# Patient Record
Sex: Male | Born: 1960 | Race: White | Hispanic: No | Marital: Single | Smoking: Former smoker
Health system: Southern US, Community
[De-identification: ages and names within clinical notes are randomized; demographics above are authoritative.]

## PROBLEM LIST (undated history)

## (undated) HISTORY — PX: FRACTURE SURGERY: SHX138

---

## 2008-02-19 ENCOUNTER — Inpatient Hospital Stay (HOSPITAL_COMMUNITY): Admission: EM | Admit: 2008-02-19 | Discharge: 2008-02-20 | Payer: Self-pay | Admitting: Emergency Medicine

## 2009-03-25 IMAGING — RF DG TIBIA/FIBULA 2V*L*
1 series · 7 of 7 positions shown · non-contrast
Comparison: 02/19/2008

CLINICAL DATA: Fracture left ankle.  ORIF.

LEFT TIBIA AND FIBULA - 2 VIEW

[Series 1: run · 7 of 7 slices shown]
[im 1/7]
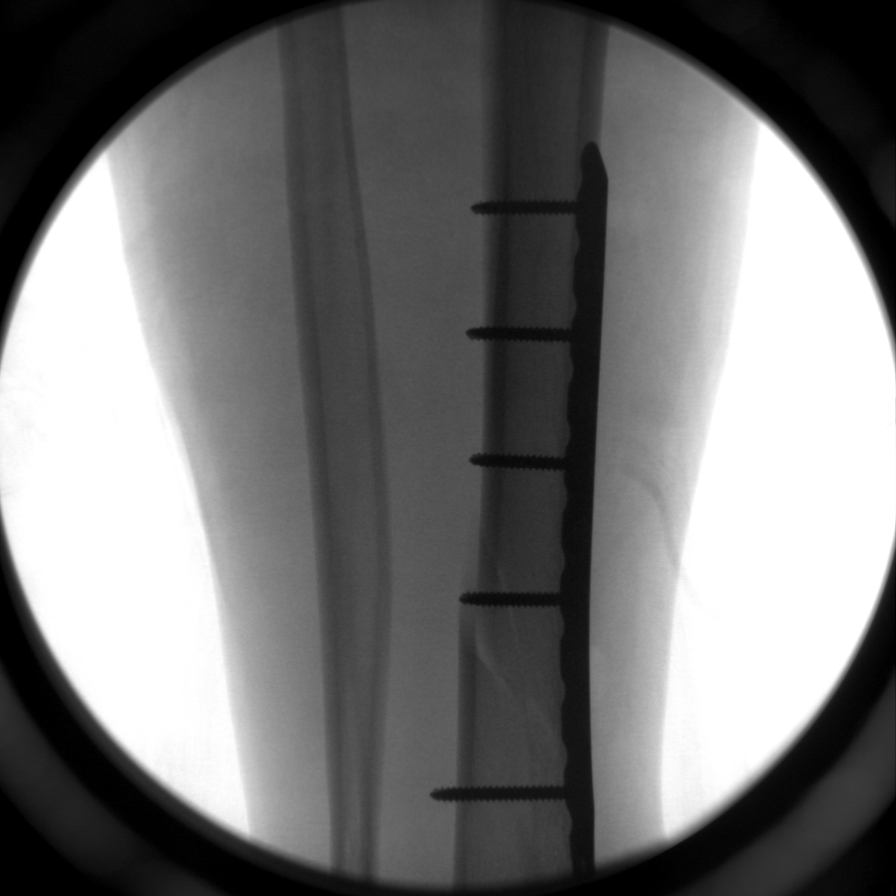
[im 2/7]
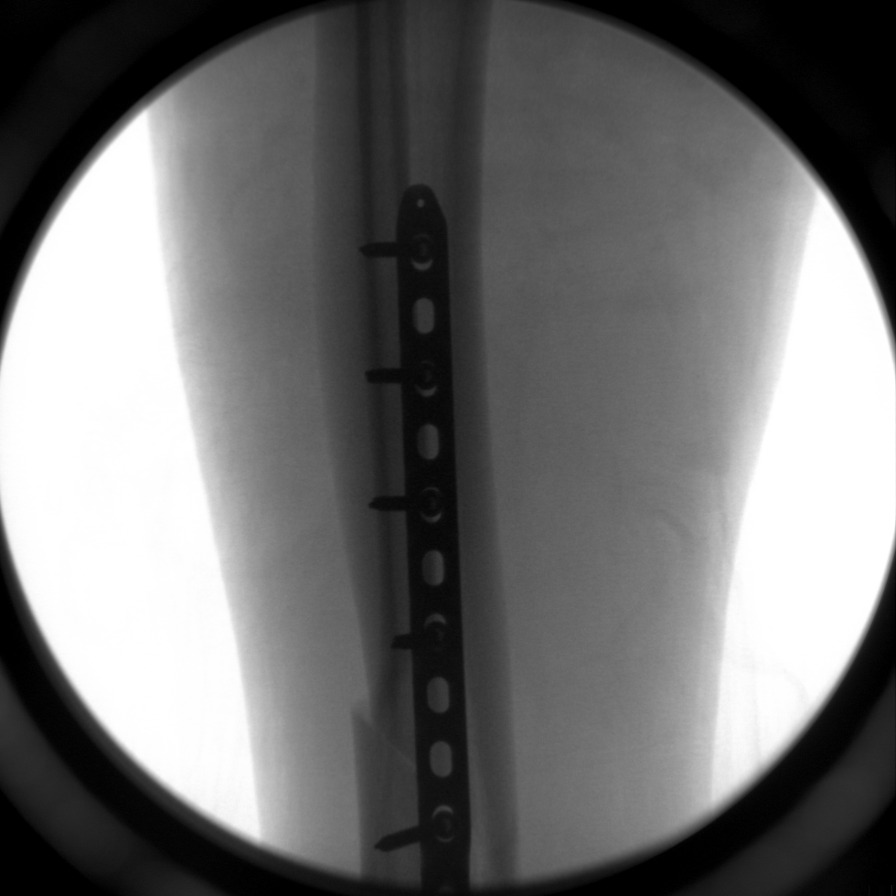
[im 3/7]
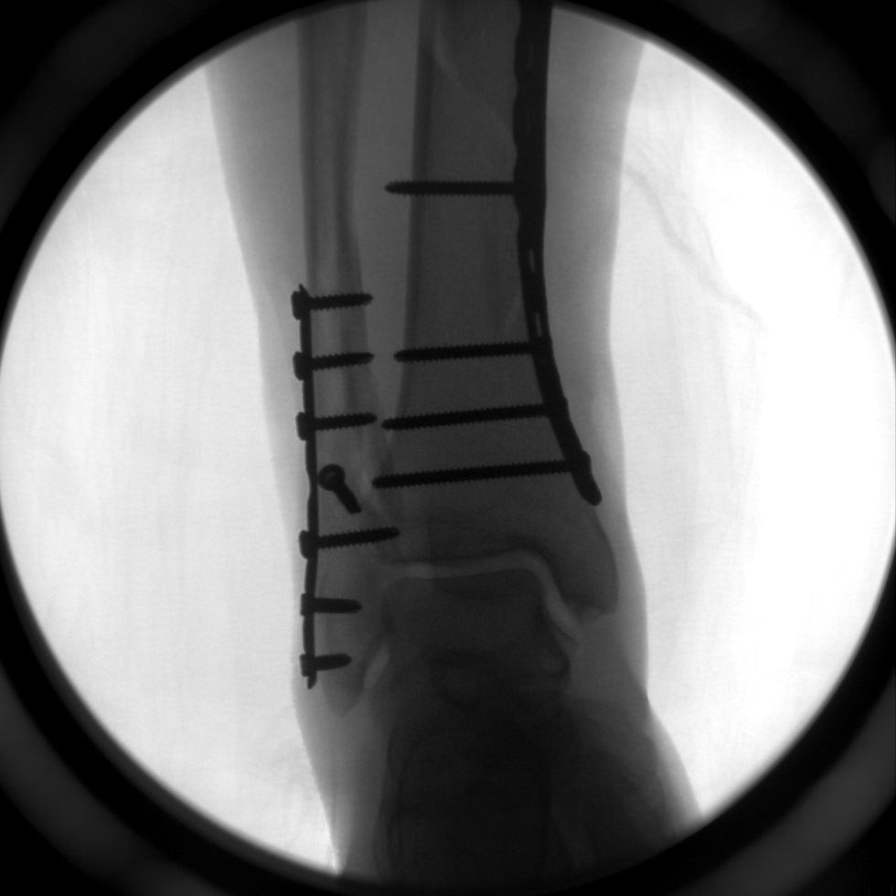
[im 4/7]
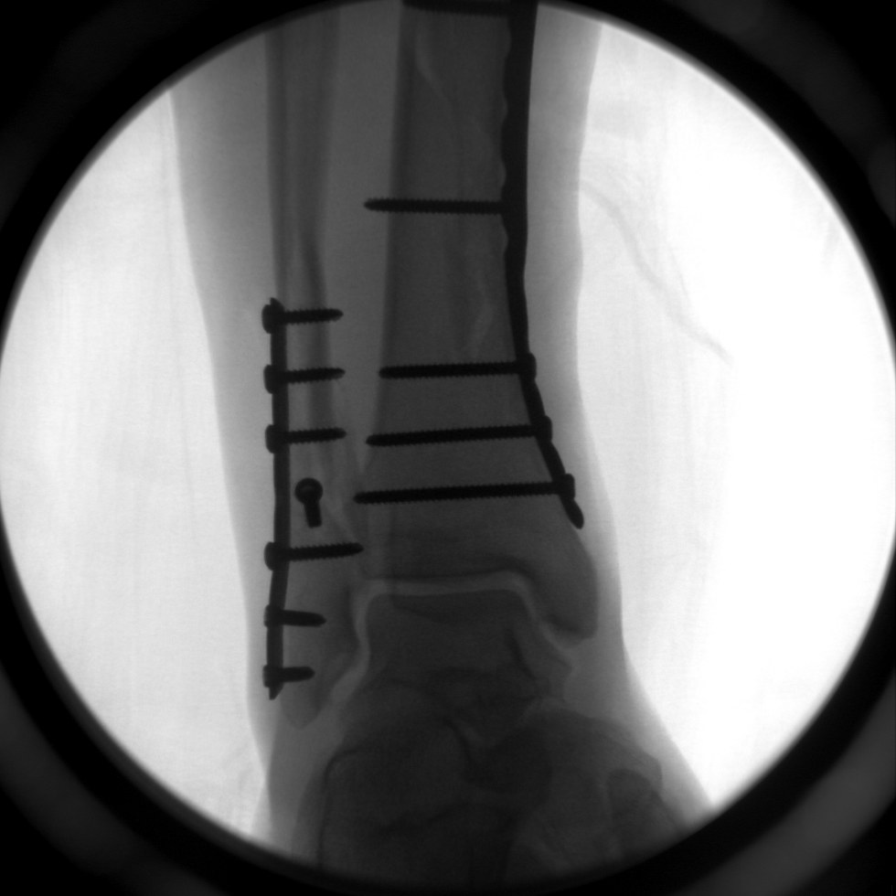
[im 5/7]
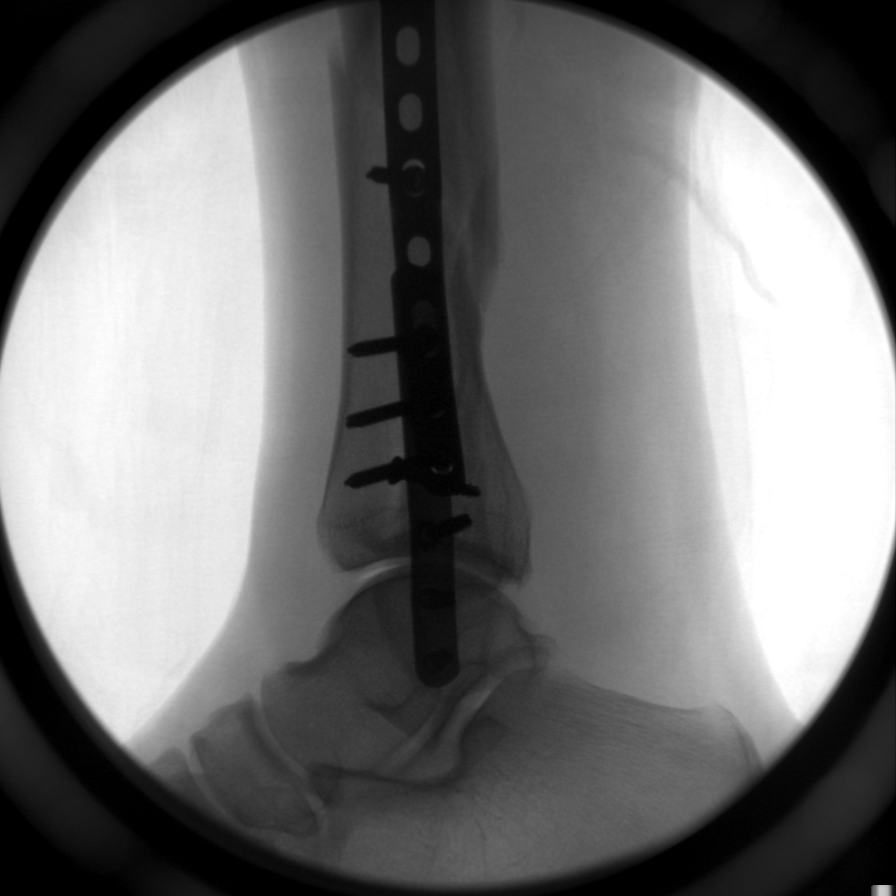
[im 6/7]
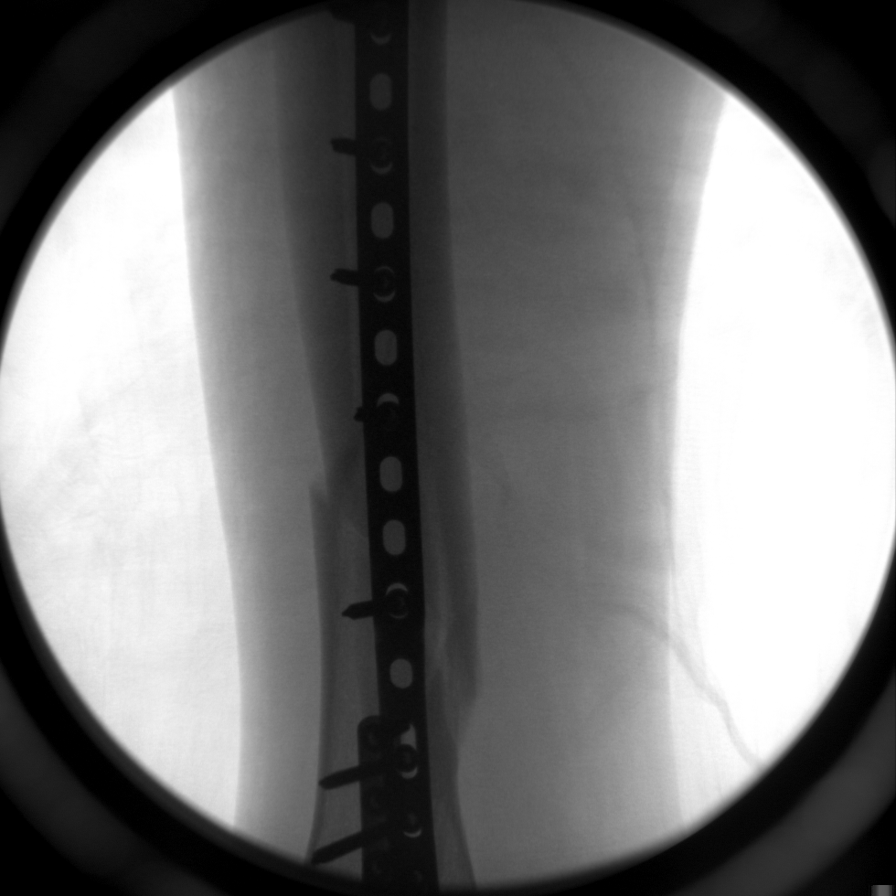
[im 7/7]
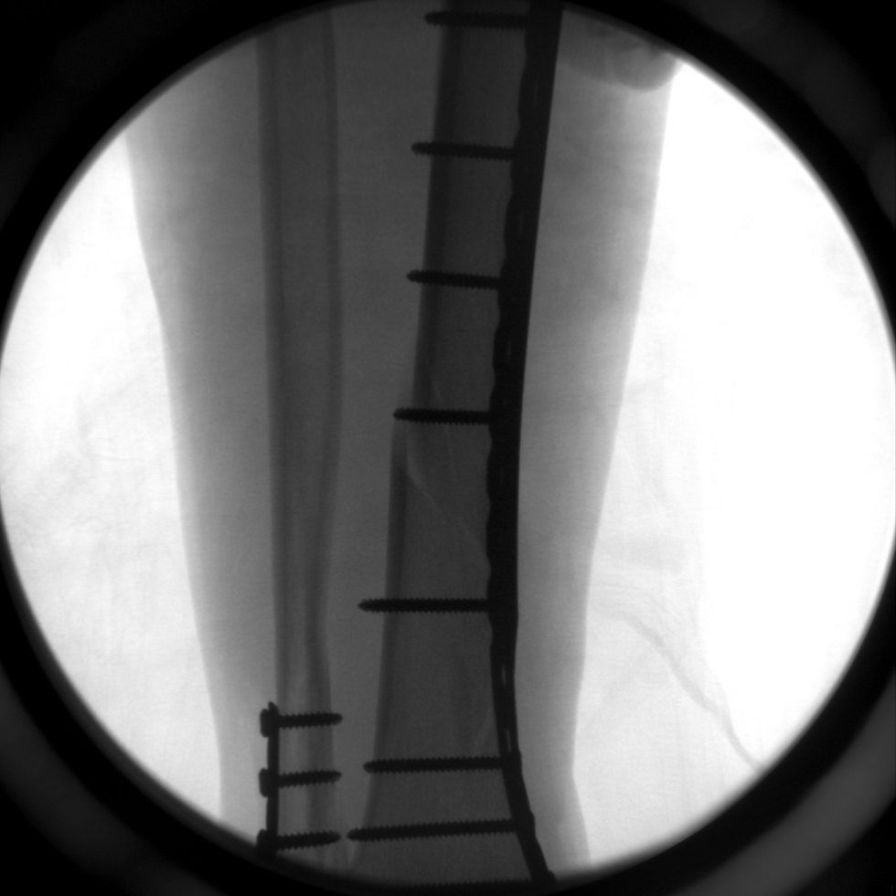

[7 of 7 positions shown; findings below may reference images not displayed]

FINDINGS: Images demonstrate a lateral screw plate fixation of
distal tibial and fibular fractures.  No new fractures are
identified.
IMPRESSION: ORIF tib-fib fracture.

## 2011-02-18 NOTE — Op Note (Signed)
NAME:  Richard Mcclain, Richard Mcclain               ACCOUNT NO.:  [id number]   MEDICAL RECORD NO.:  [id number]          PATIENT TYPE:  INP   LOCATION:  2550                         FACILITY:  [facility]   PHYSICIAN:  Leonides Grills, M.D.     DATE OF BIRTH:  Dec 04, 1960   DATE OF PROCEDURE:  DATE OF DISCHARGE:                               OPERATIVE REPORT   PREOPERATIVE DIAGNOSES:  1. Closed left spiral tibia fracture.  2. Closed left comminuted lateral malleolus fracture.   POSTOPERATIVE DIAGNOSES:  1. Closed left spiral tibia fracture.  2. Closed left comminuted lateral malleolus fracture.   OPERATION:  1. Open reduction and internal fixation of left tibia fracture.  2. Open reduction and internal fixation of left comminuted lateral      malleolus fracture.  3. Stress x-rays of the left leg.   ANESTHESIA:  General.   SURGEON:  Leonides Grills, M.D.   ASSISTANT:  Richardean Canal, P.A.   ESTIMATED BLOOD LOSS:  Minimal.   TOURNIQUET TIME:  Approximately an hour and a half.   COMPLICATIONS:  None.   DISPOSITION:  Stable to PAR.   INDICATION:  This is a 50 year old male who sustained the above injury.  He was consented for the above procedure.  All risks which include  infection or vessel injury, nonunion or malunion, hardware irritation,  hardware failure, wound complications, DVT, PE, possible death,  prolonged recovery, stiffness, or arthritis were all explained.  Questions were encouraged and answered.   OPERATION:  The patient was brought to the operating room and placed in  the supine position after adequate general endotracheal tube anesthesia  was administered as well as Ancef 1 g IV piggyback.  The left lower  extremity was prepped and draped in a sterile manner over a proximally  placed thigh tourniquet.  The limb was gravity exsanguinated.  Tourniquet was elevated to 290 mmHg.  Under x-ray guidance, the fracture  site was then visualized.  Anatomical landmarks were mapped out.  We  then  made a longitudinal incision over the medial aspect of the tibia.  This was approximately 3 cm to 4 cm.  We then tunneled to the fracture  site itself.  We then chose a 14-hole Synthes distal tibial nonlocking  plate.  We then tunneled this to the fracture site.  We then made small  incisions on both medial and lateral aspects of the tibia, and through  this, we were able to see the fracture site and slide the plate onto the  proximal portion of the tibia.  With the plate placed in the proper  position, we then reduced the fracture with a two-point Weber reduction  clamps.  Once this was anatomically placed and reduced and this was  verified in the AP and lateral planes, we then started our fixation.  We  placed a screw just distal to the fracture site through a small incision  over the plate.  Once this was placed, we then were able to place the  remaining screws, 3 most distal and 4 proximal with a hole in between  each screw and the proximal-most  screws for balance fixation.  Once this  was done through the incisions over each hole, we then obtained stress x-  rays in the AP and lateral planes that showed no gross motion, fixation,  proposition, and external alignment as well.  Once the tibia was  completed, we then made a longitudinal incision over the lateral  malleolus.  Dissection was carried down through the skin.  Hemostasis  was obtained.  Fracture site was then encountered.  This was comminuted  and had not only an oblique component but also a sagittal split of the  posterior portion of the proximal fragment.  We then carefully reduced  the fracture with 2 separate reduction clamps.  We then applied a 7-hole  one-third tubular locking plate to the lateral surface of the fibula.  We then sequentially placed a 3.5-mm fully-threaded cortical set screws  using a 2.5-mm hole respectively.  This had excellent purchase and  maintenance of the correction.  We then placed a set screw from  anterior  to posterior across the fracture site as well.  Distally, we placed two  3.5-mm fully-threaded cortical locking screws using the bullet and a 2.8-  mm drill.  Once this was done, we then obtained the final stress x-rays  in the AP and lateral planes, and they showed no gross motion, fixation,  proposition, and external alignment as well over the entire tib-fib  area.  Tourniquet was deflated.  Hemostasis was obtained.  There was no  pulsatile bleeding.  The wounds were copiously irrigated with normal  saline.  Subcutaneous was closed with 3-0 Vicryl.  Skin was closed with  4-0 nylon over all wounds.  Sterile dressing was applied.  Modified  Jones dressing was applied with the ankle in neutral dorsiflexion.  The  patient was stable to the PAR.      Leonides Grills, M.D.  Electronically Signed     PB/MEDQ  D:  02/20/2008  T:  02/20/2008  Job:  [id number]
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
## 2011-07-02 LAB — COMPREHENSIVE METABOLIC PANEL
ALT: 17
AST: 23
Albumin: 4.1
Alkaline Phosphatase: 42
BUN: 10
CO2: 28
Calcium: 9
Chloride: 104
Creatinine, Ser: 0.76
GFR calc Af Amer: >60
GFR calc non Af Amer: >60
Glucose, Bld: 118 — ABNORMAL HIGH
Potassium: 3.6
Sodium: 139
Total Bilirubin: 0.9
Total Protein: 6.4

## 2011-07-02 LAB — CBC
HCT: 42.6
Hemoglobin: 14.4
MCHC: 34
MCV: 92.1
Platelets: 203
RBC: 4.62
RDW: 12.6
WBC: 9.4

## 2011-07-02 LAB — DIFFERENTIAL
Basophils Absolute: 0
Basophils Relative: 0
Eosinophils Absolute: 0.1
Eosinophils Relative: 1
Lymphocytes Relative: 17
Lymphs Abs: 1.6
Monocytes Absolute: 0.5
Monocytes Relative: 6
Neutro Abs: 7.1
Neutrophils Relative %: 76

## 2011-07-02 LAB — URINALYSIS, ROUTINE W REFLEX MICROSCOPIC
Bilirubin Urine: NEGATIVE
Glucose, UA: NEGATIVE
Hgb urine dipstick: NEGATIVE
Ketones, ur: NEGATIVE
Nitrite: NEGATIVE
Protein, ur: NEGATIVE
Specific Gravity, Urine: 1.015
Urobilinogen, UA: 0.2
pH: 5.5

## 2011-07-02 LAB — APTT: aPTT: 26

## 2011-07-02 LAB — PROTIME-INR
INR: 1.2
Prothrombin Time: 15.1

## 2012-11-15 ENCOUNTER — Encounter: Payer: Self-pay | Admitting: Physician Assistant

## 2012-11-15 ENCOUNTER — Ambulatory Visit (INDEPENDENT_AMBULATORY_CARE_PROVIDER_SITE_OTHER): Payer: PRIVATE HEALTH INSURANCE

## 2012-11-29 ENCOUNTER — Ambulatory Visit (INDEPENDENT_AMBULATORY_CARE_PROVIDER_SITE_OTHER): Payer: PRIVATE HEALTH INSURANCE | Admitting: Family Medicine

## 2012-11-29 ENCOUNTER — Encounter: Payer: Self-pay | Admitting: Physician Assistant

## 2012-11-29 ENCOUNTER — Ambulatory Visit: Payer: PRIVATE HEALTH INSURANCE

## 2012-11-29 ENCOUNTER — Other Ambulatory Visit: Payer: Self-pay | Admitting: Physician Assistant

## 2012-11-29 VITALS — BP 107/62 | HR 58 | Temp 98.6°F | Resp 16 | Ht 71.5 in | Wt 156.4 lb

## 2012-11-29 DIAGNOSIS — M549 Dorsalgia, unspecified: Secondary | ICD-10-CM

## 2012-11-29 DIAGNOSIS — Z1211 Encounter for screening for malignant neoplasm of colon: Secondary | ICD-10-CM

## 2012-11-29 DIAGNOSIS — R32 Unspecified urinary incontinence: Secondary | ICD-10-CM

## 2012-11-29 DIAGNOSIS — Z0000 Encounter for general adult medical examination without abnormal findings: Secondary | ICD-10-CM

## 2012-11-29 DIAGNOSIS — T148XXA Other injury of unspecified body region, initial encounter: Secondary | ICD-10-CM

## 2012-11-29 DIAGNOSIS — R21 Rash and other nonspecific skin eruption: Secondary | ICD-10-CM

## 2012-11-29 DIAGNOSIS — IMO0002 Reserved for concepts with insufficient information to code with codable children: Secondary | ICD-10-CM

## 2012-11-29 LAB — POCT URINALYSIS DIPSTICK
Bilirubin, UA: NEGATIVE
Blood, UA: NEGATIVE
Glucose, UA: NEGATIVE
Ketones, UA: NEGATIVE
Leukocytes, UA: NEGATIVE
Nitrite, UA: NEGATIVE
Protein, UA: NEGATIVE
Spec Grav, UA: 1.015
Urobilinogen, UA: 0.2
pH, UA: 6.5

## 2012-11-29 LAB — COMPREHENSIVE METABOLIC PANEL
ALT: 16 U/L (ref 0–53)
AST: 23 U/L (ref 0–37)
Albumin: 4.9 g/dL (ref 3.5–5.2)
Alkaline Phosphatase: 41 U/L (ref 39–117)
BUN: 12 mg/dL (ref 6–23)
CO2: 26 mEq/L (ref 19–32)
Calcium: 9.5 mg/dL (ref 8.4–10.5)
Chloride: 104 mEq/L (ref 96–112)
Creat: 0.58 mg/dL (ref 0.50–1.35)
Glucose, Bld: 85 mg/dL (ref 70–99)
Potassium: 3.9 mEq/L (ref 3.5–5.3)
Sodium: 140 mEq/L (ref 135–145)
Total Bilirubin: 0.7 mg/dL (ref 0.3–1.2)
Total Protein: 7.2 g/dL (ref 6.0–8.3)

## 2012-11-29 LAB — TSH: TSH: 1.14 u[IU]/mL (ref 0.350–4.500)

## 2012-11-29 LAB — POCT UA - MICROSCOPIC ONLY
Bacteria, U Microscopic: NEGATIVE
Casts, Ur, LPF, POC: NEGATIVE
Crystals, Ur, HPF, POC: NEGATIVE
Mucus, UA: NEGATIVE
RBC, urine, microscopic: NEGATIVE
WBC, Ur, HPF, POC: NEGATIVE
Yeast, UA: NEGATIVE

## 2012-11-29 LAB — LIPID PANEL
Cholesterol: 225 mg/dL — ABNORMAL HIGH (ref 0–200)
HDL: 77 mg/dL (ref >39)
LDL Cholesterol: 136 mg/dL — ABNORMAL HIGH (ref 0–99)
Total CHOL/HDL Ratio: 2.9 Ratio
Triglycerides: 58 mg/dL (ref <150)
VLDL: 12 mg/dL (ref 0–40)

## 2012-11-29 LAB — CBC
HCT: 43 % (ref 39.0–52.0)
Hemoglobin: 15 g/dL (ref 13.0–17.0)
MCH: 30.7 pg (ref 26.0–34.0)
MCHC: 34.9 g/dL (ref 30.0–36.0)
MCV: 87.9 fL (ref 78.0–100.0)
Platelets: 269 10*3/uL (ref 150–400)
RBC: 4.89 MIL/uL (ref 4.22–5.81)
RDW: 13.1 % (ref 11.5–15.5)
WBC: 6.3 10*3/uL (ref 4.0–10.5)

## 2012-11-29 LAB — IFOBT (OCCULT BLOOD): IFOBT: NEGATIVE
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
## 2012-11-30 LAB — GC/CHLAMYDIA PROBE AMP, URINE
Chlamydia, Swab/Urine, PCR: NEGATIVE
GC Probe Amp, Urine: NEGATIVE

## 2012-11-30 LAB — PSA: PSA: 0.83 ng/mL (ref <=4.00)

## 2012-11-30 LAB — HSV(HERPES SIMPLEX VRS) I + II AB-IGG
HSV 1 Glycoprotein G Ab, IgG: 11.49 IV — ABNORMAL HIGH
HSV 2 Glycoprotein G Ab, IgG: 0.11 IV

## 2012-11-30 LAB — HIV ANTIBODY (ROUTINE TESTING W REFLEX): HIV: NONREACTIVE

## 2012-11-30 LAB — VITAMIN D 25 HYDROXY (VIT D DEFICIENCY, FRACTURES): Vit D, 25-Hydroxy: 30 ng/mL (ref 30–89)

## 2012-11-30 LAB — RPR

## 2012-12-04 NOTE — Progress Notes (Signed)
Xray read and patient discussed with Ryan Dunn, PA-C. Agree with assessment and plan of care per his note.   

## 2012-12-04 NOTE — Progress Notes (Signed)
This has already been resulted.  

## 2013-02-15 LAB — HM COLONOSCOPY

## 2013-02-21 ENCOUNTER — Telehealth: Payer: Self-pay | Admitting: Radiology

## 2013-02-21 NOTE — Telephone Encounter (Signed)
duplicate

## 2013-02-21 NOTE — Telephone Encounter (Signed)
Received pathology report. Richard Mcclain wants to make sure patient is aware of report. Shows polyps. Called him and he is advised, he has plans to follow up with DR Elnoria Howard for repeat colonoscopy in 3 years.

## 2021-02-20 ENCOUNTER — Encounter: Payer: Self-pay | Admitting: Physician Assistant

## 2021-02-20 ENCOUNTER — Ambulatory Visit (INDEPENDENT_AMBULATORY_CARE_PROVIDER_SITE_OTHER): Payer: Self-pay | Admitting: Physician Assistant

## 2021-02-20 ENCOUNTER — Other Ambulatory Visit: Payer: Self-pay

## 2021-02-20 DIAGNOSIS — Z1283 Encounter for screening for malignant neoplasm of skin: Secondary | ICD-10-CM

## 2021-02-20 DIAGNOSIS — D485 Neoplasm of uncertain behavior of skin: Secondary | ICD-10-CM

## 2021-02-20 DIAGNOSIS — L570 Actinic keratosis: Secondary | ICD-10-CM

## 2021-02-20 NOTE — Patient Instructions (Signed)

## 2021-02-20 NOTE — Progress Notes (Addendum)
   New Patient   Subjective  Richard Mcclain is a 60 y.o. male who presents for the following: Skin Problem (Patient has a lesion on his left ear. Its been there for months. It gets very irritated at times. No bleeding. No history of skin cancers. Not bothering patient right now. ).   The following portions of the chart were reviewed this encounter and updated as appropriate:  Tobacco  Allergies  Meds  Problems  Med Hx  Surg Hx  Fam Hx      Objective  Well appearing patient in no apparent distress; mood and affect are within normal limits.  All skin waist up examined.  Objective  waist up: No atypical nevi   Objective  Left Mid Helix: Thick pink scale     Assessment & Plan  Screening exam for skin cancer waist up  observe  Neoplasm of uncertain behavior of skin Left Mid Helix  Skin / nail biopsy Type of biopsy: tangential   Informed consent: discussed and consent obtained   Timeout: patient name, date of birth, surgical site, and procedure verified   Procedure prep:  Patient was prepped and draped in usual sterile fashion (Non sterile) Prep type:  Chlorhexidine Anesthesia: the lesion was anesthetized in a standard fashion   Anesthetic:  1% lidocaine w/ epinephrine 1-100,000 local infiltration Instrument used: flexible razor blade   Outcome: patient tolerated procedure well   Post-procedure details: wound care instructions given    Specimen 1 - Surgical pathology Differential Diagnosis: bcc scc ak  Check Margins: No    I, Sway Guttierrez, PA-C, have reviewed all documentation for this visit. The documentation on 02/20/21 for the exam, diagnosis, procedures, and orders are all accurate and complete.

## 2021-02-27 ENCOUNTER — Telehealth: Payer: Self-pay | Admitting: *Deleted

## 2021-02-27 NOTE — Telephone Encounter (Signed)
Pathology to patient.  °

## 2021-02-27 NOTE — Telephone Encounter (Signed)
-----   Message from Warren Danes, [place] sent at 02/26/2021  6:23 PM EDT ----- RTC if recurs
